# Patient Record
Sex: Female | Born: 1995 | Race: Black or African American | Hispanic: No | Marital: Single | Smoking: Never smoker
Health system: Southern US, Community
[De-identification: ages and names within clinical notes are randomized; demographics above are authoritative.]

## PROBLEM LIST (undated history)

## (undated) DIAGNOSIS — K5090 Crohn's disease, unspecified, without complications: Secondary | ICD-10-CM

---
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
## 2013-09-14 ENCOUNTER — Encounter (HOSPITAL_COMMUNITY): Payer: Self-pay | Admitting: Emergency Medicine

## 2013-09-14 ENCOUNTER — Emergency Department (HOSPITAL_COMMUNITY)
Admission: EM | Admit: 2013-09-14 | Discharge: 2013-09-15 | Disposition: A | Discharge status: Home / Self Care | Attending: Emergency Medicine | Admitting: Emergency Medicine

## 2013-09-14 DIAGNOSIS — R52 Pain, unspecified: Secondary | ICD-10-CM | POA: Insufficient documentation

## 2013-09-14 LAB — CBC WITH DIFFERENTIAL/PLATELET
BASOS PCT: 1 % (ref 0–1)
Basophils Absolute: 0.1 10*3/uL (ref 0.0–0.1)
EOS PCT: 0 % (ref 0–5)
Eosinophils Absolute: 0 10*3/uL (ref 0.0–0.7)
HEMATOCRIT: 38.6 % (ref 36.0–46.0)
HEMOGLOBIN: 12.6 g/dL (ref 12.0–15.0)
LYMPHS ABS: 1.6 10*3/uL (ref 0.7–4.0)
Lymphocytes Relative: 32 % (ref 12–46)
MCH: 22.7 pg — AB (ref 26.0–34.0)
MCHC: 32.6 g/dL (ref 30.0–36.0)
MCV: 69.4 fL — AB (ref 78.0–100.0)
MONO ABS: 0.7 10*3/uL (ref 0.1–1.0)
MONOS PCT: 14 % — AB (ref 3–12)
Neutro Abs: 2.6 10*3/uL (ref 1.7–7.7)
Neutrophils Relative %: 53 % (ref 43–77)
Platelets: 230 10*3/uL (ref 150–400)
RBC: 5.56 MIL/uL — AB (ref 3.87–5.11)
RDW: 14.8 % (ref 11.5–15.5)
WBC: 5 10*3/uL (ref 4.0–10.5)

## 2013-09-14 LAB — BASIC METABOLIC PANEL
BUN: 11 mg/dL (ref 6–23)
CALCIUM: 8.9 mg/dL (ref 8.4–10.5)
CHLORIDE: 99 meq/L (ref 96–112)
CO2: 24 meq/L (ref 19–32)
CREATININE: 0.75 mg/dL (ref 0.50–1.10)
GFR calc Af Amer: >90 mL/min (ref >90)
GFR calc non Af Amer: >90 mL/min (ref >90)
GLUCOSE: 96 mg/dL (ref 70–99)
Potassium: 4.6 mEq/L (ref 3.7–5.3)
Sodium: 136 mEq/L — ABNORMAL LOW (ref 137–147)

## 2013-09-14 NOTE — ED Notes (Signed)
Called for room x 2.  

## 2013-09-14 NOTE — ED Notes (Signed)
Call x 1

## 2013-09-14 NOTE — ED Notes (Signed)
Pt reporting generalized body aches since Sunday. Has been taking ibuprofen. Also on Monday had bowel movement noticed dark blood in stool. Has hx of hemorrhoids. Pt denies n/v. Fever present, mask in place. Pt is a x 4

## 2013-09-14 NOTE — ED Notes (Signed)
Call x2

## 2013-09-15 ENCOUNTER — Emergency Department (HOSPITAL_COMMUNITY)
Admission: EM | Admit: 2013-09-15 | Discharge: 2013-09-15 | Disposition: A | Discharge status: Home / Self Care | Attending: Emergency Medicine | Admitting: Emergency Medicine

## 2013-09-15 ENCOUNTER — Emergency Department (HOSPITAL_COMMUNITY)

## 2013-09-15 ENCOUNTER — Encounter (HOSPITAL_COMMUNITY): Payer: Self-pay | Admitting: Emergency Medicine

## 2013-09-15 DIAGNOSIS — Z3202 Encounter for pregnancy test, result negative: Secondary | ICD-10-CM | POA: Insufficient documentation

## 2013-09-15 DIAGNOSIS — R52 Pain, unspecified: Secondary | ICD-10-CM

## 2013-09-15 DIAGNOSIS — R1031 Right lower quadrant pain: Secondary | ICD-10-CM | POA: Insufficient documentation

## 2013-09-15 DIAGNOSIS — Z79899 Other long term (current) drug therapy: Secondary | ICD-10-CM | POA: Insufficient documentation

## 2013-09-15 DIAGNOSIS — R1011 Right upper quadrant pain: Secondary | ICD-10-CM | POA: Insufficient documentation

## 2013-09-15 DIAGNOSIS — Z8719 Personal history of other diseases of the digestive system: Secondary | ICD-10-CM | POA: Insufficient documentation

## 2013-09-15 DIAGNOSIS — R509 Fever, unspecified: Secondary | ICD-10-CM | POA: Insufficient documentation

## 2013-09-15 LAB — COMPREHENSIVE METABOLIC PANEL
ALBUMIN: 3.2 g/dL — AB (ref 3.5–5.2)
ALK PHOS: 90 U/L (ref 39–117)
ALT: 36 U/L — AB (ref 0–35)
AST: 42 U/L — AB (ref 0–37)
BUN: 10 mg/dL (ref 6–23)
CHLORIDE: 105 meq/L (ref 96–112)
CO2: 24 meq/L (ref 19–32)
CREATININE: 0.73 mg/dL (ref 0.50–1.10)
Calcium: 8.7 mg/dL (ref 8.4–10.5)
GFR calc Af Amer: >90 mL/min (ref >90)
Glucose, Bld: 77 mg/dL (ref 70–99)
POTASSIUM: 4.1 meq/L (ref 3.7–5.3)
Sodium: 141 mEq/L (ref 137–147)
Total Protein: 7.6 g/dL (ref 6.0–8.3)

## 2013-09-15 LAB — URINALYSIS, ROUTINE W REFLEX MICROSCOPIC
Bilirubin Urine: NEGATIVE
GLUCOSE, UA: NEGATIVE mg/dL
HGB URINE DIPSTICK: NEGATIVE
KETONES UR: 15 mg/dL — AB
Nitrite: NEGATIVE
PH: 6 (ref 5.0–8.0)
PROTEIN: NEGATIVE mg/dL
Specific Gravity, Urine: 1.028 (ref 1.005–1.030)
Urobilinogen, UA: 0.2 mg/dL (ref 0.0–1.0)

## 2013-09-15 LAB — LIPASE, BLOOD: Lipase: 22 U/L (ref 11–59)

## 2013-09-15 LAB — CBC WITH DIFFERENTIAL/PLATELET
BASOS ABS: 0.1 10*3/uL (ref 0.0–0.1)
BASOS PCT: 2 % — AB (ref 0–1)
EOS ABS: 0 10*3/uL (ref 0.0–0.7)
EOS PCT: 0 % (ref 0–5)
HEMATOCRIT: 37.9 % (ref 36.0–46.0)
Hemoglobin: 12.5 g/dL (ref 12.0–15.0)
Lymphocytes Relative: 33 % (ref 12–46)
Lymphs Abs: 1.8 10*3/uL (ref 0.7–4.0)
MCH: 22.5 pg — AB (ref 26.0–34.0)
MCHC: 33 g/dL (ref 30.0–36.0)
MCV: 68.2 fL — AB (ref 78.0–100.0)
MONO ABS: 0.5 10*3/uL (ref 0.1–1.0)
Monocytes Relative: 8 % (ref 3–12)
Neutro Abs: 3.1 10*3/uL (ref 1.7–7.7)
Neutrophils Relative %: 58 % (ref 43–77)
PLATELETS: 194 10*3/uL (ref 150–400)
RBC: 5.56 MIL/uL — ABNORMAL HIGH (ref 3.87–5.11)
RDW: 14.6 % (ref 11.5–15.5)
WBC: 5.5 10*3/uL (ref 4.0–10.5)

## 2013-09-15 LAB — URINE MICROSCOPIC-ADD ON

## 2013-09-15 LAB — POC URINE PREG, ED: PREG TEST UR: NEGATIVE

## 2013-09-15 MED ORDER — SODIUM CHLORIDE 0.9 % IV BOLUS (SEPSIS)
1000.0000 mL | Freq: Once | INTRAVENOUS | Status: AC
Start: 2013-09-15 — End: 2013-09-15
  Administered 2013-09-15: 1000 mL via INTRAVENOUS

## 2013-09-15 MED ORDER — MORPHINE SULFATE 4 MG/ML IJ SOLN
4.0000 mg | Freq: Once | INTRAMUSCULAR | Status: AC
  Administered 2013-09-15: 4 mg via INTRAVENOUS
  Filled 2013-09-15: qty 1

## 2013-09-15 NOTE — ED Notes (Addendum)
She was here last night for body aches, fevers since Sunday. She also noticed some blood in her stool on Sunday when she had her last BM. She did not feel like waiting last night so she left before the doctor saw her. She returned today states she still feels bad. She took 800mg  ibuprofen last night with some relief of symptoms.

## 2013-09-15 NOTE — ED Provider Notes (Signed)
CSN: [id number]     Arrival date & time 09/15/13  1001 History   First MD Initiated Contact with Patient 09/15/13 1106     Chief Complaint  Patient presents with  . Generalized Body Aches     (Consider location/radiation/quality/duration/timing/severity/associated sxs/prior Treatment) HPI18 yo female with hx of Chron's taht was diagnosed 3 years prior by colonoscopy. Patient has had many colonoscopies since. Denies ever having been on medications for chron's. Patient presents today with complaints of generalized body aches and fever since Sunday. Patient also having right sided abdominal pain described as achy and rated 7/10 but states this is typical of her Chron's type pain. Patient more concerned because she doesn't normally get body aches with her chron's flares. Patient admits to some scant bloody stools though admits to hx of hemorrhoids. Patient also states she has not had a BM since Sunday as well and her reuglar schedule is usually  Once daily.  Denies N/V/D, dysuria, hematuria, urinary frequency/urgency. Denies any additional sxs. LMP 09/12/13. Denies hx of abdominal surgeries.  Past Medical History  Diagnosis Date  . Crohn disease    History reviewed. No pertinent past surgical history. History reviewed. No pertinent family history. History  Substance Use Topics  . Smoking status: Never Smoker   . Smokeless tobacco: Not on file  . Alcohol Use: No   OB History   Grav Para Term Preterm Abortions TAB SAB Ect Mult Living                 Review of Systems  All other systems reviewed and are negative.      Allergies  Shellfish allergy  Home Medications   Current Outpatient Rx  Name  Route  Sig  Dispense  Refill  . cetirizine (ZYRTEC) 10 MG tablet   Oral   Take 10 mg by mouth daily as needed for allergies.          Marland Kitchen ibuprofen (ADVIL,MOTRIN) 800 MG tablet   Oral   Take 800 mg by mouth every 6 (six) hours as needed. As needed for pain.         .  Norgestimate-Ethinyl Estradiol Triphasic (ORTHO TRI-CYCLEN LO) 0.18/0.215/0.25 MG-25 MCG tab   Oral   Take 1 tablet by mouth daily.          BP 123/82  Pulse 79  Temp(Src) 99.2 F (37.3 C) (Oral)  Resp 18  Ht 5\' 10"  (1.778 m)  Wt 200 lb (90.719 kg)  BMI 28.70 kg/m2  SpO2 99%  LMP 09/09/2013 Physical Exam  Nursing note and vitals reviewed. Constitutional: She is oriented to person, place, and time. She appears well-developed and well-nourished. No distress.  HENT:  Head: Normocephalic and atraumatic.  Eyes: Conjunctivae are normal. No scleral icterus.  Neck: Trachea normal, normal range of motion and full passive range of motion without pain. Neck supple. No JVD present. No spinous process tenderness and no muscular tenderness present. No rigidity. No tracheal deviation, no edema, no erythema and normal range of motion present.  Cardiovascular: Normal rate and regular rhythm.  Exam reveals no gallop and no friction rub.   No murmur heard. Pulmonary/Chest: Effort normal and breath sounds normal. No respiratory distress. She has no wheezes. She has no rhonchi. She has no rales.  Abdominal: Soft. She exhibits no distension. Bowel sounds are decreased. There is no hepatosplenomegaly. There is tenderness in the right upper quadrant and right lower quadrant. There is no rigidity, no rebound, no guarding, no CVA tenderness, no  tenderness at McBurney's point and negative Murphy's sign.  Minimal tenderness. Non surgical abdomen.   Musculoskeletal: She exhibits no edema.  Neurological: She is alert and oriented to person, place, and time.  Skin: Skin is warm and dry. No rash noted. She is not diaphoretic.  Psychiatric: She has a normal mood and affect. Her behavior is normal.    ED Course  Procedures (including critical care time) Labs Review Labs Reviewed  URINALYSIS, ROUTINE W REFLEX MICROSCOPIC - Abnormal; Notable for the following:    APPearance HAZY (*)    Ketones, ur 15 (*)     Leukocytes, UA MODERATE (*)    All other components within normal limits  COMPREHENSIVE METABOLIC PANEL - Abnormal; Notable for the following:    Albumin 3.2 (*)    AST 42 (*)    ALT 36 (*)    Total Bilirubin <0.2 (*)    All other components within normal limits  CBC WITH DIFFERENTIAL - Abnormal; Notable for the following:    RBC 5.56 (*)    MCV 68.2 (*)    MCH 22.5 (*)    Basophils Relative 2 (*)    All other components within normal limits  URINE MICROSCOPIC-ADD ON - Abnormal; Notable for the following:    Squamous Epithelial / LPF MANY (*)    Bacteria, UA MANY (*)    All other components within normal limits  URINE CULTURE  LIPASE, BLOOD  POC URINE PREG, ED   Imaging Review Dg Chest 2 View  09/15/2013   CLINICAL DATA:  Fever, chills.  EXAM: CHEST  2 VIEW  COMPARISON:  None available for comparison at time of study interpretation.  FINDINGS: Cardiomediastinal silhouette is unremarkable. The lungs are clear without pleural effusions or focal consolidations. Trachea projects midline and there is no pneumothorax. Soft tissue planes and included osseous structures are non-suspicious. Multiple piercings.  IMPRESSION: No acute cardiopulmonary process ; normal chest radiograph.   Electronically Signed   By: Awilda Metroourtnay  Bloomer   On: 09/15/2013 12:28     EKG Interpretation None      MDM   Final diagnoses:  Body aches  Fever    Patient febrile to 100.4 on presentation. Improved with stay in ED.  Pain controlled in ED.  Urine preg negative. Doubt ectopic UA shows moderate leukocytes with many bacteria but appears contaminated, will get culture. Patient asymptomatic for UTI. Minimal transaminitis. May be related to viral etiology. Doubt biliary etiology given hx and exam.  Lipase negative, doubt pancreatitis CBC shows no leukocytosis.  CXR negative.  Upon reexamination patient sxs are improving with tx received today in the ED.  Patient abdomen is soft and nontender at this time.   Patient tolerating POs in ED at this time.  Plan to have patient follow up with GI for Chron's.  Return precautions given for any worsening abdominal pain, fever/chills, intractable nausea/vomiting, or patient unable to tolerate POs. Patient invited to return to ED for reevaluation at any point if they feel their condition is getting worse. Patient confirms understanding. Agrees with plan. Discharged in good condition.   No obviouis sources of infection on exam or labs/imaging. Suspicious for mono vs acute chron's flare. Due to sxs for patient's presentation more consistent with mono than her typical chron's flare, plan to treat supportively with oral hydration, and OTC tylenol/motrin for bodyaches and fever. Patietn prior flares never treated with medication. Advised return for any worsening sxs.   Meds given in ED:  Medications  sodium chloride  0.9 % bolus 1,000 mL (0 mLs Intravenous Stopped 09/15/13 1244)  morphine 4 MG/ML injection 4 mg (4 mg Intravenous Given 09/15/13 1154)  morphine 4 MG/ML injection 4 mg (4 mg Intravenous Given 09/15/13 1300)    Discharge Medication List as of 09/15/2013  2:00 PM        Rudene Anda, PA-C 09/16/13 0945

## 2013-09-15 NOTE — Discharge Instructions (Signed)
Make follow up appointment with [facility] as listed above for further management of your Chron's. Take tylenol and motrin OTC as needed for fever/chills and body aches. Avoid playing any contact sports in case your symptoms are related to Mono.    Fever, Adult A fever is a temperature of 100.4 F (38 C) or above.  HOME CARE  Take fever medicine as told by your doctor. Do not  take aspirin for fever if you are younger than 20 years of age.  If you are given antibiotic medicine, take it as told. Finish the medicine even if you start to feel better.  Rest.  Drink enough fluids to keep your pee (urine) clear or pale yellow. Do not drink alcohol.  Take a bath or shower with room temperature water. Do not use ice water or alcohol sponge baths.  Wear lightweight, loose clothes. GET HELP RIGHT AWAY IF:   You are short of breath or have trouble breathing.  You are very weak.  You are dizzy or you pass out (faint).  You are very thirsty or are making little or no urine.  You have new pain.  You throw up (vomit) or have watery poop (diarrhea).  You keep throwing up or having watery poop for more than 1 to 2 days.  You have a stiff neck or light bothers your eyes.  You have a skin rash.  You have a fever or problems (symptoms) that last for more than 2 to 3 days.  You have a fever and your problems quickly get worse.  You keep throwing up the fluids you drink.  You do not feel better after 3 days.  You have new problems. MAKE SURE YOU:   Understand these instructions.  Will watch your condition.  Will get help right away if you are not doing well or get worse. Document Released: 03/18/2008 Document Revised: 09/01/2011 Document Reviewed: 04/10/2011 [facility] LPExit[facility] Patient Information 2014 [place], [place].

## 2013-09-15 NOTE — ED Notes (Signed)
Given appple juice per Dr Molli Knockokay

## 2013-09-16 LAB — URINE CULTURE: Colony Count: 30000

## 2013-09-17 NOTE — ED Provider Notes (Signed)
Medical screening examination/treatment/procedure(s) were conducted as a shared visit with non-physician practitioner(s) and myself.  I personally evaluated the patient during the encounter.   EKG Interpretation None      Clayton LefortKierra Zuniga is a 18 y.o. female hx of Crohn's here with fever, myalgias for several days. She has chronic abdominal pain from Crohn's that is not worsened. Had some bloody stools but has hx of hemorrhoids. She had recent colonoscopy that showed that she was not having Crohn's flare. Vitals stable, low grade temp here. Heart, lung exam unremarkable. Mild R sided abdominal tenderness, no rebound (she said this is chronic). WBC nl. CXR, UA contaminated. Likely have viral syndrome. I doubt crohn's flare. Recommend motrin, tylenol, GI and PMD f/u.    Richardean Canalavid H Hayde Kilgour, MD 09/17/13 [phone]

## 2015-07-03 IMAGING — CR DG CHEST 2V
2 series · 2 of 2 positions shown · non-contrast
Comparison: None available for comparison at time of study
interpretation.

CLINICAL DATA: Fever, chills.

EXAM:
CHEST  2 VIEW

[w chest pa]
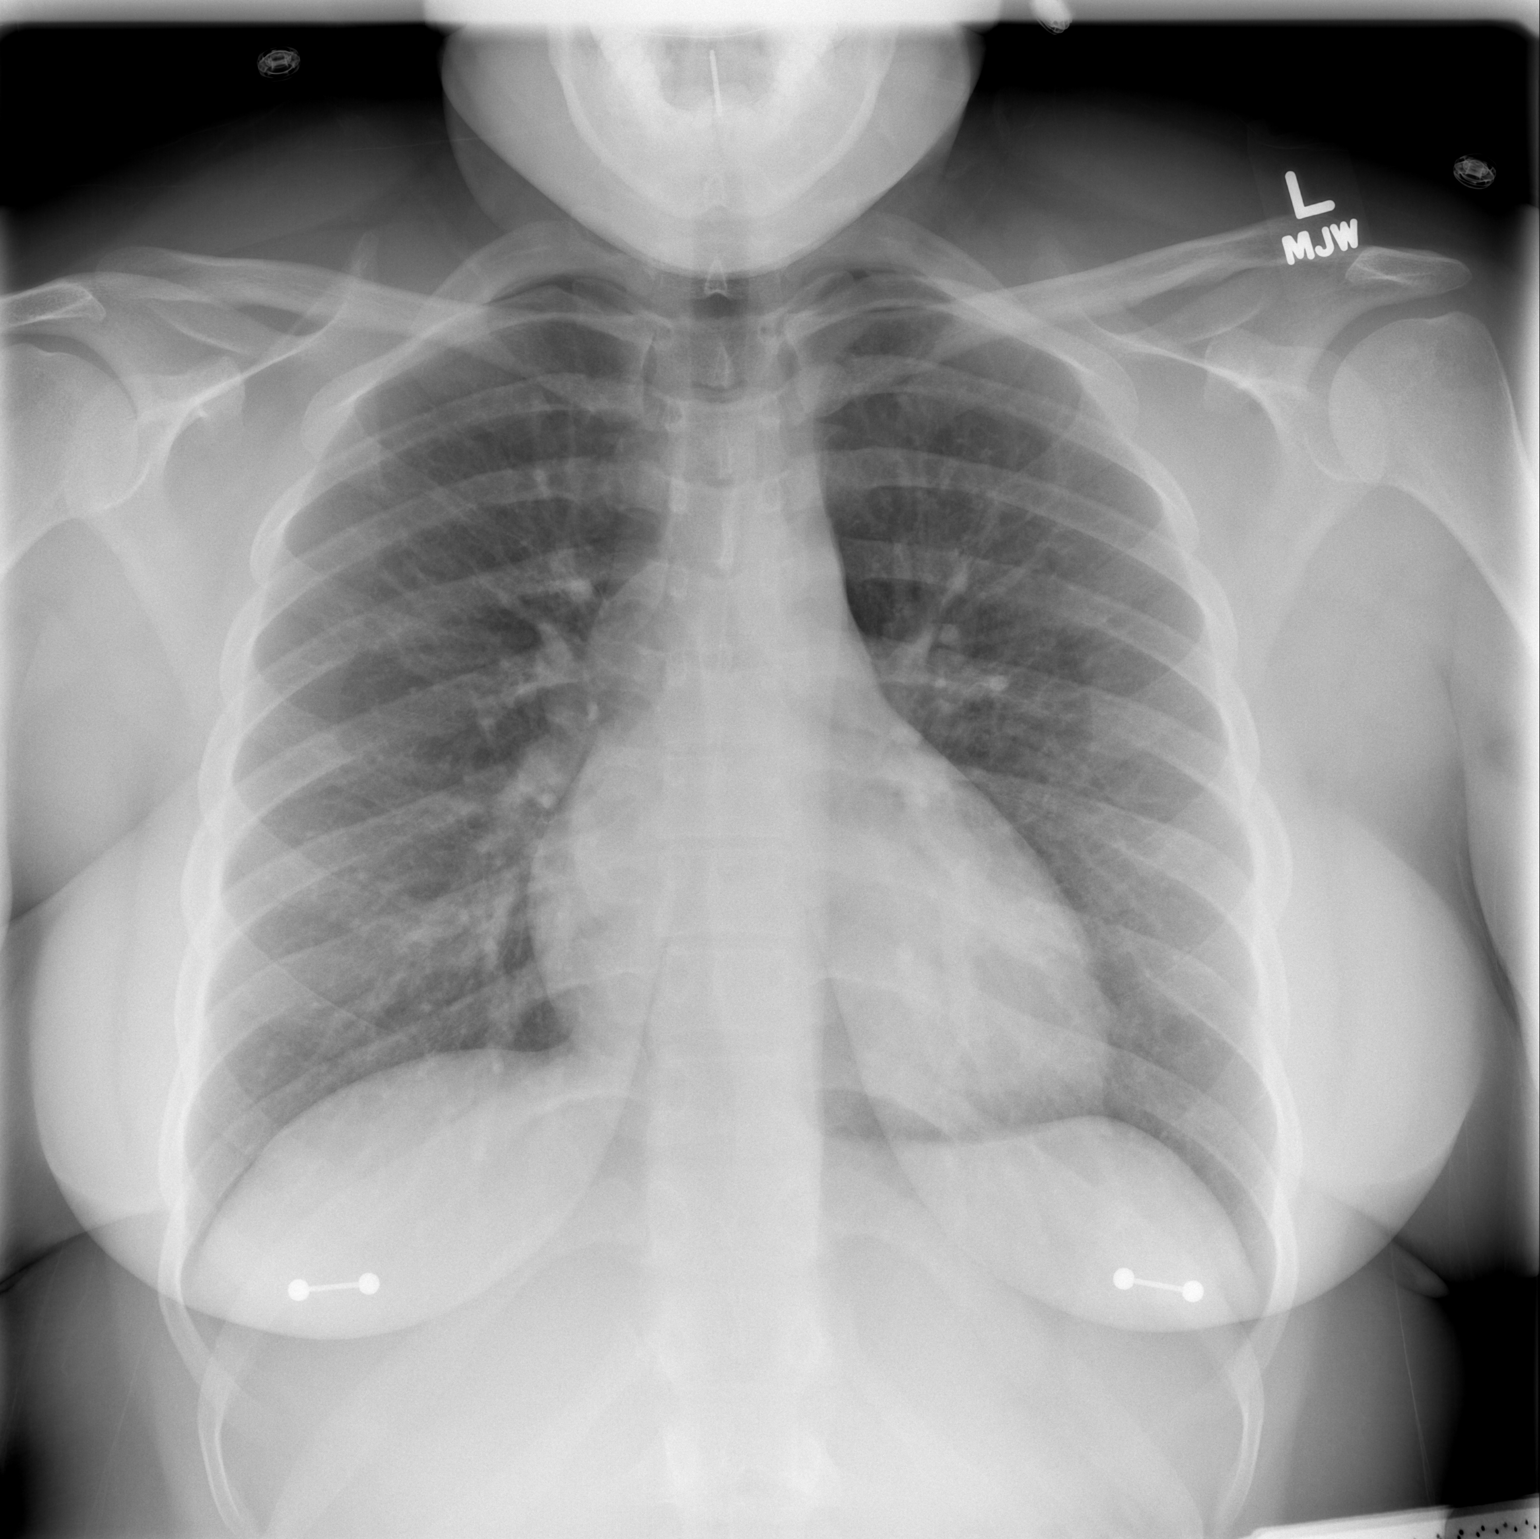

[w chest lat]
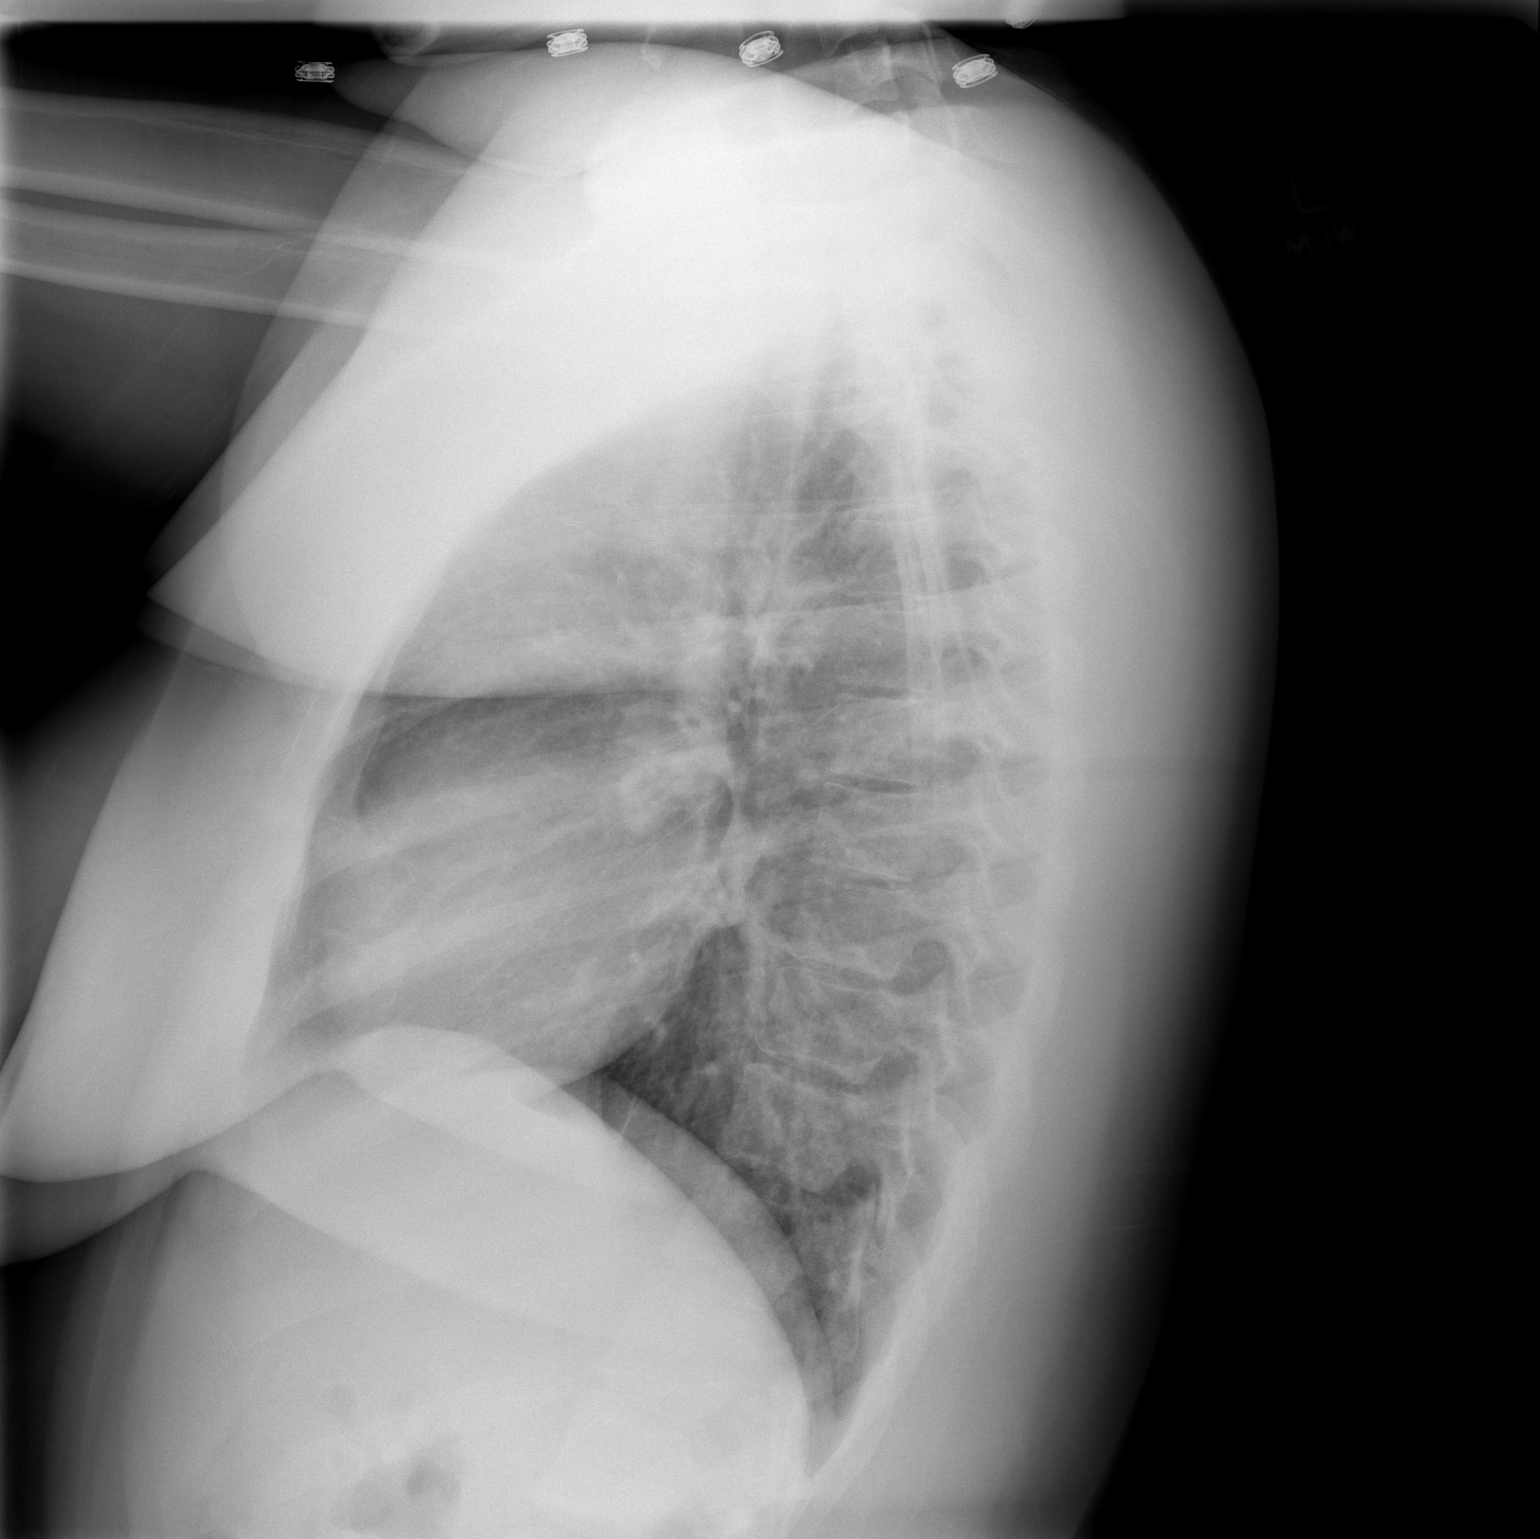

[2 of 2 positions shown; findings below may reference images not displayed]

FINDINGS: Cardiomediastinal silhouette is unremarkable. The lungs are clear
without pleural effusions or focal consolidations. Trachea projects
midline and there is no pneumothorax. Soft tissue planes and
included osseous structures are non-suspicious. Multiple piercings.
IMPRESSION: No acute cardiopulmonary process ; normal chest radiograph.

  By: Porsha Ramalingam
